# Patient Record
Sex: Female | Born: 2000 | Race: White | Marital: Married | Smoking: Current some day smoker
Health system: Southern US, Community
[De-identification: ages and names within clinical notes are randomized; demographics above are authoritative.]

## PROBLEM LIST (undated history)

## (undated) DIAGNOSIS — F419 Anxiety disorder, unspecified: Secondary | ICD-10-CM

## (undated) DIAGNOSIS — F319 Bipolar disorder, unspecified: Secondary | ICD-10-CM

## (undated) DIAGNOSIS — G40909 Epilepsy, unspecified, not intractable, without status epilepticus: Secondary | ICD-10-CM

## (undated) DIAGNOSIS — F32A Depression, unspecified: Secondary | ICD-10-CM

## (undated) HISTORY — DX: Epilepsy, unspecified, not intractable, without status epilepticus: G40.909

## (undated) HISTORY — DX: Anxiety disorder, unspecified: F41.9

## (undated) HISTORY — DX: Bipolar disorder, unspecified: F31.9

## (undated) HISTORY — PX: APPENDECTOMY: SHX54

## (undated) HISTORY — PX: ADENOIDECTOMY: SUR15

## (undated) HISTORY — PX: TYMPANOSTOMY TUBE PLACEMENT: SHX32

## (undated) HISTORY — DX: Depression, unspecified: F32.A

---

## 2020-09-09 ENCOUNTER — Other Ambulatory Visit: Payer: Self-pay | Admitting: Neurology

## 2020-09-09 DIAGNOSIS — R569 Unspecified convulsions: Secondary | ICD-10-CM

## 2020-09-22 ENCOUNTER — Other Ambulatory Visit: Payer: Self-pay | Admitting: Neurology

## 2020-09-22 ENCOUNTER — Ambulatory Visit
Admission: RE | Admit: 2020-09-22 | Discharge: 2020-09-22 | Disposition: A | Discharge status: Home / Self Care | Payer: 59 | Source: Ambulatory Visit | Attending: Neurology | Admitting: Neurology

## 2020-09-22 ENCOUNTER — Other Ambulatory Visit: Payer: Self-pay

## 2020-09-22 DIAGNOSIS — R569 Unspecified convulsions: Secondary | ICD-10-CM | POA: Insufficient documentation

## 2020-09-22 MED ORDER — GADOBUTROL 1 MMOL/ML IV SOLN: 10.0000 mL | Freq: Once | INTRAVENOUS | Status: DC | PRN

## 2020-09-26 ENCOUNTER — Other Ambulatory Visit: Payer: Self-pay

## 2020-09-26 ENCOUNTER — Encounter: Payer: Self-pay | Admitting: Family Medicine

## 2020-09-26 ENCOUNTER — Ambulatory Visit (INDEPENDENT_AMBULATORY_CARE_PROVIDER_SITE_OTHER): Payer: 59 | Admitting: Family Medicine

## 2020-09-26 VITALS — BP 111/75 | HR 147 | Temp 98.4°F | Resp 16 | Ht 67.5 in | Wt 219.0 lb

## 2020-09-26 DIAGNOSIS — F319 Bipolar disorder, unspecified: Secondary | ICD-10-CM | POA: Diagnosis not present

## 2020-09-26 DIAGNOSIS — R000 Tachycardia, unspecified: Secondary | ICD-10-CM | POA: Diagnosis not present

## 2020-09-26 DIAGNOSIS — G40909 Epilepsy, unspecified, not intractable, without status epilepticus: Secondary | ICD-10-CM | POA: Diagnosis not present

## 2020-09-26 DIAGNOSIS — E559 Vitamin D deficiency, unspecified: Secondary | ICD-10-CM

## 2020-09-26 DIAGNOSIS — R7989 Other specified abnormal findings of blood chemistry: Secondary | ICD-10-CM | POA: Diagnosis not present

## 2020-09-26 DIAGNOSIS — Z8669 Personal history of other diseases of the nervous system and sense organs: Secondary | ICD-10-CM

## 2020-09-26 DIAGNOSIS — E538 Deficiency of other specified B group vitamins: Secondary | ICD-10-CM

## 2020-09-26 NOTE — Progress Notes (Signed)
New patient visit   Patient: Kelly Robertson   DOB: 2001-09-15   19 y.o. Female  MRN: [id number] Visit Date: 09/26/2020  Today's healthcare provider: Dortha Kern, PA-C   Chief Complaint  Patient presents with  . Establish Care   Subjective    Kelly Robertson is a 19 y.o. female who presents today as a new patient to establish care.      Past Medical History:  Diagnosis Date  . Anxiety   . Bipolar disorder (HCC)   . Depression   . Seizure disorder [facility])      Family Status  Relation Name Status  . Mother  (Not Specified)  . Father  (Not Specified)  . MGM  (Not Specified)  . MGF  (Not Specified)  . PGF  (Not Specified)  . Other paternal aunt Alive    Social History   Socioeconomic History  . Marital status: Married    Spouse name: Not on file  . Number of children: Not on file  . Years of education: Not on file  . Highest education level: Not on file  Occupational History  . Not on file  Tobacco Use  . Smoking status: Current Some Day Smoker    Types: E-cigarettes  . Smokeless tobacco: Never Used  Substance and Sexual Activity  . Alcohol use: Never  . Drug use: Not on file  . Sexual activity: Not on file  Other Topics Concern  . Not on file  Social History Narrative  . Not on file   Social Determinants of Health   Financial Resource Strain: Not on file  Food Insecurity: Not on file  Transportation Needs: Not on file  Physical Activity: Not on file  Stress: Not on file  Social Connections: Not on file   Outpatient Medications Prior to Visit  Medication Sig  . citalopram (CELEXA) 10 MG tablet Take 10 mg by mouth daily.  . cyanocobalamin (,VITAMIN B-12,) 1000 MCG/ML injection Inject 1,000 mcg into the muscle every 30 (thirty) days.  . ergocalciferol (VITAMIN D2) 1.25 MG (50000 UT) capsule Take 1 capsule by mouth once a week.  . lamoTRIgine (LAMICTAL) 25 MG tablet Take by mouth. Take 2 tablets (50 mg total) by mouth 2 (two) times daily  for 90 days  . nortriptyline (PAMELOR) 50 MG capsule Take 50 mg by mouth at bedtime.  . propranolol (INDERAL) 10 MG tablet Take 10 mg by mouth daily as needed.  Marland Kitchen QUEtiapine (SEROQUEL XR) 300 MG 24 hr tablet Take 300 mg by mouth at bedtime.  . SUMAtriptan (IMITREX) 100 MG tablet Take by mouth. Take 1 tablet (100 mg total) by mouth as directed for Migraine May take a second dose after 2 hours if needed.  . vitamin B-12 (CYANOCOBALAMIN) 1000 MCG tablet Take 1,000 mcg by mouth daily.   No facility-administered medications prior to visit.   No Known Allergies   There is no immunization history on file for this patient.  Health Maintenance  Topic Date Due  . Hepatitis C Screening  Never done  . COVID-19 Vaccine (1) Never done  . HIV Screening  Never done  . TETANUS/TDAP  Never done  . INFLUENZA VACCINE  05/19/2020    Patient Care Team: Linell Shawn, Maryjean Morn as PCP - General (Family Medicine)  Review of Systems  Constitutional: Negative.   HENT: Negative.   Eyes: Negative.   Respiratory: Negative.   Cardiovascular: Positive for palpitations.  Gastrointestinal: Negative.   Genitourinary: Negative.   Musculoskeletal: Negative.  Neurological: Negative.       Objective    BP 111/75 (BP Location: Right Arm, Patient Position: Sitting, Cuff Size: Large)   Pulse (!) 147   Temp 98.4 F (36.9 C) (Oral)   Resp 16   Ht 5' 7.5" (1.715 m)   LMP 09/02/2020   BMI 33.02 kg/m  Physical Exam Constitutional:      General: She is not in acute distress.    Appearance: She is well-developed and well-nourished.  HENT:     Head: Normocephalic and atraumatic.     Right Ear: Hearing normal.     Left Ear: Hearing normal.     Nose: Nose normal.  Eyes:     General: Lids are normal. No scleral icterus.       Right eye: No discharge.        Left eye: No discharge.     Conjunctiva/sclera: Conjunctivae normal.  Cardiovascular:     Rate and Rhythm: Regular rhythm. Tachycardia present.      Heart sounds: Normal heart sounds.  Pulmonary:     Effort: Pulmonary effort is normal. No respiratory distress.  Musculoskeletal:        General: Normal range of motion.     Cervical back: Neck supple.  Skin:    General: Skin is intact.     Findings: No lesion or rash.  Neurological:     Mental Status: She is alert and oriented to person, place, and time.  Psychiatric:        Mood and Affect: Mood and affect normal.        Speech: Speech normal.        Behavior: Behavior normal.        Thought Content: Thought content normal.      Depression Screen No flowsheet data found. No results found for any visits on 09/26/20.  Assessment & Plan     1. Tachycardia Persistent fast heart beat without complaint of chest pain or dyspnea. Occasional palpitation. Evaluation at [facility] on 08-04-20 showed normal EKG with heart rate of 85. Stated she has noticed episodes of tachycardia since psychiatrist changed meds for Bipolar Disorder. Also, reported some syncopal episodes that was determined to be a seizure disorder. Will recheck labs and may need evaluation by cardiologist. Recommend she take the Propranolol 10 mg regularly instead of only for anxiety attacks. Should go to the ER if chest pains or further syncopal episodes. - CBC with Differential/Platelet - Thyroid Panel With TSH - Comprehensive metabolic panel  2. Elevated TSH TSH was 6.426 on 08-28-20 and neurologist (Dr. Sherryll Burger) scheduled for endocrinology evaluation by Dr. Turner Daniels on 12-04-20. Get follow up of CBC and a more complete thyroid panel.Marland Kitchen Heart rate high today but has been gaining weight without tremor, exophthalmos, pretibial edema, diarrhea, hair loss, dry skin or brittle nails. - CBC with Differential/Platelet - Thyroid Panel With TSH - Comprehensive metabolic panel  3. Seizure disorder [facility]) Diagnosed by Dr. Sherryll Burger (neurologist) and confirmation on EEG on 09-22-20. Presently on Lamictal 50 mg BID. Initial work up found B12 low at  174 and Vitamin D 12,2 on 08-28-20. Presently getting B12 injection 1000 mcg IM q month. Recheck labs and follow up with Dr. Sherryll Burger as planned. - CBC with Differential/Platelet - Thyroid Panel With TSH - Comprehensive metabolic panel  4. Bipolar affective disorder, remission status unspecified (HCC) Followed by [facility] in [facility] for Bipolar Disorder. Treated with Seroquel 300 mg hs, Celexa 10 mg qd and Propranolol 10 mg qd prn  anxiety attacks. Needs recheck and possible dosage adjustments as tachycardia started as these meds were changed or initiated. - CBC with Differential/Platelet - Thyroid Panel With TSH - Comprehensive metabolic panel  5. History of migraine Fair control of headaches with use of Nortriptyline 50 mg hs and prn use of Sumatriptan at onset of migraine. Should follow up with neurologist.  6. Vitamin D deficiency Presently on 50,000 IU q week with Vitamin D level 12.2 on 08-28-20 at [facility]. Recheck CBC. - CBC with Differential/Platelet  7. B12 deficiency Betting IM 1000 mcg injection once a month for B12 level down to 174 on 08-28-20. Schedule follow up pending reports of lab tests. - CBC with Differential/Platelet    No follow-ups on file.        Dortha Kern, PA-C  [facility] [phone] (phone) [phone] (fax)  [facility]

## 2020-09-27 LAB — CBC WITH DIFFERENTIAL/PLATELET
Basophils Absolute: 0 10*3/uL (ref 0.0–0.2)
Basos: 1 %
EOS (ABSOLUTE): 0.2 10*3/uL (ref 0.0–0.4)
Eos: 3 %
Hematocrit: 36.3 % (ref 34.0–46.6)
Hemoglobin: 12.2 g/dL (ref 11.1–15.9)
Immature Grans (Abs): 0 10*3/uL (ref 0.0–0.1)
Immature Granulocytes: 0 %
Lymphocytes Absolute: 2.7 10*3/uL (ref 0.7–3.1)
Lymphs: 35 %
MCH: 25.2 pg — ABNORMAL LOW (ref 26.6–33.0)
MCHC: 33.6 g/dL (ref 31.5–35.7)
MCV: 75 fL — ABNORMAL LOW (ref 79–97)
Monocytes Absolute: 0.6 10*3/uL (ref 0.1–0.9)
Monocytes: 7 %
Neutrophils Absolute: 4.2 10*3/uL (ref 1.4–7.0)
Neutrophils: 54 %
Platelets: 335 10*3/uL (ref 150–450)
RBC: 4.84 x10E6/uL (ref 3.77–5.28)
RDW: 13.5 % (ref 11.7–15.4)
WBC: 7.8 10*3/uL (ref 3.4–10.8)

## 2020-09-27 LAB — THYROID PANEL WITH TSH
Free Thyroxine Index: 1.3 (ref 1.2–4.9)
T3 Uptake Ratio: 25 % (ref 24–39)
T4, Total: 5.2 ug/dL (ref 4.5–12.0)
TSH: 2.58 u[IU]/mL (ref 0.450–4.500)

## 2020-09-27 LAB — COMPREHENSIVE METABOLIC PANEL
ALT: 24 IU/L (ref 0–32)
AST: 15 IU/L (ref 0–40)
Albumin/Globulin Ratio: 1.5 (ref 1.2–2.2)
Albumin: 4.4 g/dL (ref 3.9–5.0)
Alkaline Phosphatase: 105 IU/L (ref 42–106)
BUN/Creatinine Ratio: 9 (ref 9–23)
BUN: 7 mg/dL (ref 6–20)
Bilirubin Total: 0.6 mg/dL (ref 0.0–1.2)
CO2: 21 mmol/L (ref 20–29)
Calcium: 9.2 mg/dL (ref 8.7–10.2)
Chloride: 103 mmol/L (ref 96–106)
Creatinine, Ser: 0.82 mg/dL (ref 0.57–1.00)
GFR calc Af Amer: 120 mL/min/{1.73_m2} (ref >59)
GFR calc non Af Amer: 104 mL/min/{1.73_m2} (ref >59)
Globulin, Total: 2.9 g/dL (ref 1.5–4.5)
Glucose: 104 mg/dL — ABNORMAL HIGH (ref 65–99)
Potassium: 4 mmol/L (ref 3.5–5.2)
Sodium: 137 mmol/L (ref 134–144)
Total Protein: 7.3 g/dL (ref 6.0–8.5)

## 2020-12-09 ENCOUNTER — Ambulatory Visit (INDEPENDENT_AMBULATORY_CARE_PROVIDER_SITE_OTHER): Payer: 59 | Admitting: Physician Assistant

## 2020-12-09 ENCOUNTER — Ambulatory Visit: Payer: Self-pay | Admitting: *Deleted

## 2020-12-09 ENCOUNTER — Encounter: Payer: Self-pay | Admitting: Physician Assistant

## 2020-12-09 ENCOUNTER — Other Ambulatory Visit: Payer: Self-pay

## 2020-12-09 VITALS — BP 102/70 | HR 98 | Temp 98.5°F | Wt 213.0 lb

## 2020-12-09 DIAGNOSIS — T7840XA Allergy, unspecified, initial encounter: Secondary | ICD-10-CM

## 2020-12-09 DIAGNOSIS — R21 Rash and other nonspecific skin eruption: Secondary | ICD-10-CM

## 2020-12-09 DIAGNOSIS — F319 Bipolar disorder, unspecified: Secondary | ICD-10-CM | POA: Insufficient documentation

## 2020-12-09 DIAGNOSIS — G40909 Epilepsy, unspecified, not intractable, without status epilepticus: Secondary | ICD-10-CM | POA: Diagnosis not present

## 2020-12-09 MED ORDER — LEVETIRACETAM 500 MG PO TABS: 500.0000 mg | ORAL_TABLET | Freq: Two times a day (BID) | ORAL | 1 refills | Status: DC

## 2020-12-09 MED ORDER — BETAMETHASONE DIPROPIONATE AUG 0.05 % EX CREA: TOPICAL_CREAM | Freq: Two times a day (BID) | CUTANEOUS | 0 refills | Status: DC

## 2020-12-09 NOTE — Progress Notes (Signed)
Established patient visit   Patient: Kelly Robertson   DOB: [birth year]   20 y.o. Female  MRN: [id number] Visit Date: 12/09/2020  Today's healthcare provider: Margaretann Loveless, PA-C   Chief Complaint  Patient presents with  . Allergic Reaction   Subjective    Allergic Reaction This is a new problem. The problem has been gradually improving since onset. The patient was exposed to a prescription drug. Associated symptoms include itching and a rash. Pertinent negatives include no abdominal pain, chest pain, chest pressure, coughing, difficulty breathing or trouble swallowing. Swelling is present on the tongue. Treatments tried: Stopped lamotrigine  The treatment provided moderate relief.   Pt also reports having 3-4 focal seizures a day since stopping lamotrigine.   Developed mouth sores with bleeding, severe itching, and a blistering rash that is painful on the right side of the face after increasing lamotrigine. Since she has stopped the medication the rash, mouth sores and itching are slowly improving.   HPI    To lamotrigine after increasing to 150mg  daily.    Last edited by , CMA on 12/09/2020  3:31 PM. (History)        There are no problems to display for this patient.      Medications: Outpatient Medications Prior to Visit  Medication Sig  . citalopram (CELEXA) 10 MG tablet Take 10 mg by mouth daily.  . cyanocobalamin (,VITAMIN B-12,) 1000 MCG/ML injection Inject 1,000 mcg into the muscle every 30 (thirty) days.  . propranolol (INDERAL) 10 MG tablet Take 10 mg by mouth 2 (two) times daily.  . SUMAtriptan (IMITREX) 100 MG tablet Take by mouth. Take 1 tablet (100 mg total) by mouth as directed for Migraine May take a second dose after 2 hours if needed.  . vitamin B-12 (CYANOCOBALAMIN) 1000 MCG tablet Take 1,000 mcg by mouth daily.  12/11/2020 lamoTRIgine (LAMICTAL) 25 MG tablet Take by mouth. Take 2 tablets (50 mg total) by mouth 2 (two) times daily for 90  days (Patient not taking: Reported on 12/09/2020)  . nortriptyline (PAMELOR) 50 MG capsule Take 50 mg by mouth at bedtime. (Patient not taking: Reported on 12/09/2020)  . QUEtiapine (SEROQUEL XR) 300 MG 24 hr tablet Take 300 mg by mouth at bedtime. (Patient not taking: Reported on 12/09/2020)   No facility-administered medications prior to visit.    Review of Systems  Constitutional: Negative.   HENT: Positive for mouth sores and tinnitus. Negative for sore throat and trouble swallowing.   Respiratory: Negative.  Negative for cough.   Cardiovascular: Negative for chest pain.  Gastrointestinal: Negative for abdominal pain.  Skin: Positive for itching and rash. Negative for color change, pallor and wound.        Objective    BP 102/70 (BP Location: Right Arm, Patient Position: Sitting, Cuff Size: Large)   Pulse 98   Temp 98.5 F (36.9 C) (Oral)   Wt 213 lb (96.6 kg)   BMI 32.87 kg/m    Physical Exam Vitals reviewed.  Constitutional:      General: She is not in acute distress.    Appearance: Normal appearance. She is well-developed and well-nourished. She is obese. She is not ill-appearing or diaphoretic.  HENT:     Head: Normocephalic and atraumatic.   Neck:     Thyroid: No thyromegaly.     Vascular: No JVD.     Trachea: No tracheal deviation.  Cardiovascular:     Rate and Rhythm: Normal rate and regular  rhythm.     Heart sounds: Normal heart sounds. No murmur heard. No friction rub. No gallop.   Pulmonary:     Effort: Pulmonary effort is normal. No respiratory distress.     Breath sounds: Normal breath sounds. No wheezing or rales.  Musculoskeletal:     Cervical back: Normal range of motion and neck supple.  Lymphadenopathy:     Cervical: No cervical adenopathy.  Neurological:     Mental Status: She is alert.       No results found for any visits on 12/09/20.  Assessment & Plan     1. 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   2. Seizure disorder (HCC) Will change therapy to Keppra as below. Psychiatrist is going to add a new mood stabilizer instead of trying to use one medication to treat both. Referral for a new Neurologist was placed as below. Patient previously seen by Dr. Sherryll Burger. Wishes to see someone in [place] and preferably female. Call if any side effects occur before seen by Neurology.  - levETIRAcetam (KEPPRA) 500 MG tablet; Take 1 tablet (500 mg total) by mouth 2 (two) times daily.  Dispense: 60 tablet; Refill: 1 - Ambulatory referral to Neurology  3. Bipolar affective disorder, remission status unspecified (HCC) See above medical treatment plan. - Ambulatory referral to Neurology  4. Rash Betamethasone given for the rash on the cheek. Call if not improving.  - augmented betamethasone dipropionate (DIPROLENE-AF) 0.05 % cream; Apply topically 2 (two) times daily.  Dispense: 30 g; Refill: 0   No follow-ups on file.      Delmer Islam, PA-C, have reviewed all documentation for this visit. The documentation on 12/09/20 for the exam, diagnosis, procedures, and orders are all accurate and complete.   Reine Just  [facility] [phone] (phone) [phone] (fax)  [facility]

## 2020-12-09 NOTE — Telephone Encounter (Signed)
Mallery calls-Recently taken off Seroquel XR 300 mg tabs now only taking Lamictal 25 mg tabs which cause itching, rash on cheeks and other areas, sores in the mouth. Reporting the Seroquel helped prevent the itching and breakout rash from the Lamictal. Rash on cheeks scabbed over and mouth sores healing and itching improved with taking benadryl daily. She stopped taking Lamictal 3 days ago due to these side effects and has been having mini short focal seizures on/off since then. Appointment scheduled for today via DT with Virl Axe, PA for this afternoon  Reason for Disposition . [1] Caller has URGENT medicine question about med that PCP or specialist prescribed AND [2] triager unable to answer question  Answer Assessment - Initial Assessment Questions 1. NAME of MEDICATION: "What medicine are you calling about?"     Lamictal  2. QUESTION: "What is your question?" (e.g., medication refill, side effect)     Side effect 3. PRESCRIBING HCP: "Who prescribed it?" Reason: if prescribed by specialist, call should be referred to that group.     Psychiatrist 4. SYMPTOMS: "Do you have any symptoms?"     Clearing rash on cheeks at this time, sore in mouth. 5. SEVERITY: If symptoms are present, ask "Are they mild, moderate or severe?"     mild 6. PREGNANCY:  "Is there any chance that you are pregnant?" "When was your last menstrual period?"     na  Protocols used: MEDICATION QUESTION CALL-A-AH

## 2020-12-09 NOTE — Patient Instructions (Signed)
Levetiracetam tablets What is this medicine? LEVETIRACETAM (lee ve tye RA se tam) is an antiepileptic drug. It is used with other medicines to treat certain types of seizures. This medicine may be used for other purposes; ask your health care provider or pharmacist if you have questions. COMMON BRAND NAME(S): Keppra, Roweepra What should I tell my health care provider before I take this medicine? They need to know if you have any of these conditions:  kidney disease  suicidal thoughts, plans, or attempt; a previous suicide attempt by you or a family member  an unusual or allergic reaction to levetiracetam, other medicines, foods, dyes, or preservatives  pregnant or trying to get pregnant  breast-feeding How should I use this medicine? Take this medicine by mouth with a glass of water. Follow the directions on the prescription label. Swallow the tablets whole. Do not crush or chew this medicine. You may take this medicine with or without food. Take your doses at regular intervals. Do not take your medicine more often than directed. Do not stop taking this medicine or any of your seizure medicines unless instructed by your doctor or health care professional. Stopping your medicine suddenly can increase your seizures or their severity. A special MedGuide will be given to you by the pharmacist with each prescription and refill. Be sure to read this information carefully each time. Contact your pediatrician or health care professional regarding the use of this medication in children. While this drug may be prescribed for children as young as 4 years of age for selected conditions, precautions do apply. Overdosage: If you think you have taken too much of this medicine contact a poison control center or emergency room at once. NOTE: This medicine is only for you. Do not share this medicine with others. What if I miss a dose? If you miss a dose, take it as soon as you can. If it is almost time for  your next dose, take only that dose. Do not take double or extra doses. What may interact with this medicine? This medicine may interact with the following medications:  carbamazepine  colesevelam  probenecid  sevelamer This list may not describe all possible interactions. Give your health care provider a list of all the medicines, herbs, non-prescription drugs, or dietary supplements you use. Also tell them if you smoke, drink alcohol, or use illegal drugs. Some items may interact with your medicine. What should I watch for while using this medicine? Visit your doctor or health care provider for a regular check on your progress. Wear a medical identification bracelet or chain to say you have epilepsy, and carry a card that lists all your medications. This medicine may cause serious skin reactions. They can happen weeks to months after starting the medicine. Contact your health care provider right away if you notice fevers or flu-like symptoms with a rash. The rash may be red or purple and then turn into blisters or peeling of the skin. Or, you might notice a red rash with swelling of the face, lips or lymph nodes in your neck or under your arms. It is important to take this medicine exactly as instructed by your health care provider. When first starting treatment, your dose may need to be adjusted. It may take weeks or months before your dose is stable. You should contact your doctor or health care provider if your seizures get worse or if you have any new types of seizures. You may get drowsy or dizzy. Do not drive,   use machinery, or do anything that needs mental alertness until you know how this medicine affects you. Do not stand or sit up quickly, especially if you are an older patient. This reduces the risk of dizzy or fainting spells. Alcohol may interfere with the effect of this medicine. Avoid alcoholic drinks. The use of this medicine may increase the chance of suicidal thoughts or actions.  Pay special attention to how you are responding while on this medicine. Any worsening of mood, or thoughts of suicide or dying should be reported to your health care provider right away. Women who become pregnant while using this medicine may enroll in the [place] American Antiepileptic Drug Pregnancy Registry by calling [phone]. This registry collects information about the safety of antiepileptic drug use during pregnancy. What side effects may I notice from receiving this medicine? Side effects that you should report to your doctor or health care professional as soon as possible:  allergic reactions like skin rash, itching or hives, swelling of the face, lips, or tongue  breathing problems  dark urine  general ill feeling or flu-like symptoms  problems with balance, talking, walking  rash, fever, and swollen lymph nodes  redness, blistering, peeling or loosening of the skin, including inside the mouth  unusually weak or tired  worsening of mood, thoughts or actions of suicide or dying  yellowing of the eyes or skin Side effects that usually do not require medical attention (report to your doctor or health care professional if they continue or are bothersome):  diarrhea  dizzy, drowsy  headache  loss of appetite This list may not describe all possible side effects. Call your doctor for medical advice about side effects. You may report side effects to FDA at 1-800-FDA-1088. Where should I keep my medicine? Keep out of reach of children. Store at room temperature between 15 and 30 degrees C (59 and 86 degrees F). Throw away any unused medicine after the expiration date. NOTE: This sheet is a summary. It may not cover all possible information. If you have questions about this medicine, talk to your doctor, pharmacist, or health care provider.  2021 Elsevier/Gold Standard (2019-01-06 15:23:36)

## 2020-12-18 ENCOUNTER — Telehealth (INDEPENDENT_AMBULATORY_CARE_PROVIDER_SITE_OTHER): Payer: 59 | Admitting: Physician Assistant

## 2020-12-18 ENCOUNTER — Encounter: Payer: Self-pay | Admitting: Physician Assistant

## 2020-12-18 ENCOUNTER — Telehealth: Payer: Self-pay

## 2020-12-18 ENCOUNTER — Other Ambulatory Visit: Payer: Self-pay

## 2020-12-18 VITALS — Temp 98.0°F

## 2020-12-18 DIAGNOSIS — L089 Local infection of the skin and subcutaneous tissue, unspecified: Secondary | ICD-10-CM

## 2020-12-18 DIAGNOSIS — B009 Herpesviral infection, unspecified: Secondary | ICD-10-CM | POA: Diagnosis not present

## 2020-12-18 MED ORDER — VALACYCLOVIR HCL 1 G PO TABS: 1000.0000 mg | ORAL_TABLET | Freq: Three times a day (TID) | ORAL | 0 refills | Status: AC

## 2020-12-18 MED ORDER — CEPHALEXIN 500 MG PO CAPS: 500.0000 mg | ORAL_CAPSULE | Freq: Two times a day (BID) | ORAL | 0 refills | Status: DC

## 2020-12-18 NOTE — Progress Notes (Signed)
MyChart Video Visit    Virtual Visit via Video Note   This visit type was conducted due to national recommendations for restrictions regarding the COVID-19 Pandemic (e.g. social distancing) in an effort to limit this patient's exposure and mitigate transmission in our community. This patient is at least at moderate risk for complications without adequate follow up. This format is felt to be most appropriate for this patient at this time. Physical exam was limited by quality of the video and audio technology used for the visit.   Interactive audio and video communications were attempted, although failed due to patient's inability to connect to video. Continued visit with audio only interaction with patient agreement.  Patient location: Home Provider location: [facility]  I discussed the limitations of evaluation and management by telemedicine and the availability of in person appointments. The patient expressed understanding and agreed to proceed.  Patient: Kelly Robertson   DOB: Nov 23, 2000   19 y.o. Female  MRN: [id number] Visit Date: 12/18/2020  Today's healthcare provider: Margaretann Loveless, PA-C   Chief Complaint  Patient presents with  . Fever   Subjective    Fever  This is a new problem. The current episode started in the past 7 days. The maximum temperature noted was 100 to 100.9 F (had fever for about 3 days; first day was 102, since has been in the 100 range. None today). The temperature was taken using an oral thermometer. Associated symptoms include abdominal pain, muscle aches and a rash. Pertinent negatives include no chest pain, congestion, coughing, diarrhea, ear pain, headaches, nausea, sleepiness, sore throat, urinary pain, vomiting or wheezing. She has tried NSAIDs for the symptoms. The treatment provided mild relief.    Covid testing is negative.  Patient Active Problem List   Diagnosis Date Noted  . Bipolar disorder (HCC) 12/09/2020  . Seizure disorder (HCC)  12/09/2020   Past Medical History:  Diagnosis Date  . Anxiety   . Bipolar disorder (HCC)   . Depression   . Seizure disorder (HCC)    Allergies  Allergen Reactions  . Lamictal [Lamotrigine] Rash    Had blistering rash of the face, mouth sores with bleeding and severe itching      Medications: Outpatient Medications Prior to Visit  Medication Sig  . augmented betamethasone dipropionate (DIPROLENE-AF) 0.05 % cream Apply topically 2 (two) times daily.  . citalopram (CELEXA) 10 MG tablet Take 10 mg by mouth daily.  . cyanocobalamin (,VITAMIN B-12,) 1000 MCG/ML injection Inject 1,000 mcg into the muscle every 30 (thirty) days.  Marland Kitchen levETIRAcetam (KEPPRA) 500 MG tablet Take 1 tablet (500 mg total) by mouth 2 (two) times daily.  . propranolol (INDERAL) 10 MG tablet Take 10 mg by mouth 2 (two) times daily.  . SUMAtriptan (IMITREX) 100 MG tablet Take by mouth. Take 1 tablet (100 mg total) by mouth as directed for Migraine May take a second dose after 2 hours if needed.  . vitamin B-12 (CYANOCOBALAMIN) 1000 MCG tablet Take 1,000 mcg by mouth daily.   No facility-administered medications prior to visit.    Review of Systems  Constitutional: Positive for fever.  HENT: Negative for congestion, ear pain and sore throat.   Respiratory: Negative for cough and wheezing.   Cardiovascular: Negative for chest pain.  Gastrointestinal: Positive for abdominal pain. Negative for diarrhea, nausea and vomiting.  Genitourinary: Negative for dysuria.  Skin: Positive for rash.  Neurological: Negative for headaches.    Last CBC Lab Results  Component Value Date  WBC 7.8 09/26/2020   HGB 12.2 09/26/2020   HCT 36.3 09/26/2020   MCV 75 (L) 09/26/2020   MCH 25.2 (L) 09/26/2020   RDW 13.5 09/26/2020   PLT 335 09/26/2020   Last metabolic panel Lab Results  Component Value Date   GLUCOSE 104 (H) 09/26/2020   NA 137 09/26/2020   K 4.0 09/26/2020   CL 103 09/26/2020   CO2 21 09/26/2020   BUN 7  09/26/2020   CREATININE 0.82 09/26/2020   GFRNONAA 104 09/26/2020   GFRAA 120 09/26/2020   CALCIUM 9.2 09/26/2020   PROT 7.3 09/26/2020   ALBUMIN 4.4 09/26/2020   LABGLOB 2.9 09/26/2020   AGRATIO 1.5 09/26/2020   BILITOT 0.6 09/26/2020   ALKPHOS 105 09/26/2020   AST 15 09/26/2020   ALT 24 09/26/2020      Objective    Temp 98 F (36.7 C) (Oral)  BP Readings from Last 3 Encounters:  12/09/20 102/70  09/26/20 111/75   Wt Readings from Last 3 Encounters:  12/09/20 213 lb (96.6 kg) (98 %, Z= 2.12)*  09/26/20 219 lb (99.3 kg) (99 %, Z= 2.20)*  09/22/20 214 lb (97.1 kg) (98 %, Z= 2.14)*   * Growth percentiles are based on CDC (Girls, 2-20 Years) data.      Physical Exam     Assessment & Plan     1. Skin infection Has skin lesion on face that continues to build pus under a scab, so much so that it is causing the scab to push off from the discharge underneath. Will treat for possible skin infection/possible impetigo with keflex as noted below. Call if worsening.  - cephALEXin (KEFLEX) 500 MG capsule; Take 1 capsule (500 mg total) by mouth 2 (two) times daily.  Dispense: 14 capsule; Refill: 0  2. 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 Had reaction to lamictal that may have flared this. Will give valtrex as below. Call if worsening  - valACYclovir (VALTREX) 1000 MG tablet; Take 1 tablet (1,000 mg total) by mouth 3 (three) times daily.  Dispense: 21 tablet; Refill: 0   No follow-ups on file.     I discussed the assessment and treatment plan with the patient. The patient was provided an opportunity to ask questions and all were answered. The patient agreed with the plan and demonstrated an understanding of the instructions.   The patient was advised to call back or seek an in-person evaluation if the symptoms worsen or if the condition fails to improve as anticipated.  I provided 8 minutes of non-face-to-face time during this encounter.  Delmer Islam, PA-C, have  reviewed all documentation for this visit. The documentation on 12/18/20 for the exam, diagnosis, procedures, and orders are all accurate and complete.  Reine Just [facility] [phone] (phone) [phone] (fax)  [facility]

## 2020-12-18 NOTE — Telephone Encounter (Signed)
Copied from CRM [id number]. Topic: General - Other >> Dec 18, 2020  1:19 PM Jaquita Rector A wrote: Reason for CRM: Patient wife called in to inform Joycelyn Man that patient has a rash on her face that that are liquid and puss filled, scabs over and leak, fever, muscle pain, lightheadedness had diarrhea one day think she has an infection. Took Covid test that was negative on 12/17/20. Please advise patient has a virtual visit set for 2.20 PM Ph# [phone]

## 2020-12-25 ENCOUNTER — Ambulatory Visit: Payer: Self-pay | Admitting: *Deleted

## 2020-12-25 NOTE — Telephone Encounter (Signed)
I returned pt's call.  Her wife, Kelly Robertson answered and introduced herself as Kelly Robertson's wife.   Kelly Robertson is on the [facility].   Kelly Robertson is at work so [employer] on her behalf.  I was not able to locate any information specifically pertaining to interactions between Keppra and Triazolam.  I suggested she call her pharmacist.   She was agreeable to this and thanked me for my help.    Reason for Disposition . [1] Caller has medicine question about med NOT prescribed by PCP AND [2] triager unable to answer question (e.g., compatibility with other med, storage)  Answer Assessment - Initial Assessment Questions 1. NAME of MEDICATION: "What medicine are you calling about?"     Keppra interactions with Triazolam.   She is for a dental procedure tomorrow and she is prescribed the Triazolam for sedation.   She also takes Keppra 2. QUESTION: "What is your question?" (e.g., medication refill, side effect)     She is wondering if there is an interaction that could happen with both of these drugs on board. 3. PRESCRIBING HCP: "Who prescribed it?" Reason: if prescribed by specialist, call should be referred to that group.     Her dentist prescribed the Triazolam. 4. SYMPTOMS: "Do you have any symptoms?"     N/A 5. SEVERITY: If symptoms are present, ask "Are they mild, moderate or severe?"     N/A 6. PREGNANCY:  "Is there any chance that you are pregnant?" "When was your last menstrual period?"     Not asked  Protocols used: MEDICATION QUESTION CALL-A-AH

## 2021-02-03 ENCOUNTER — Other Ambulatory Visit: Payer: Self-pay

## 2021-02-03 DIAGNOSIS — G40909 Epilepsy, unspecified, not intractable, without status epilepticus: Secondary | ICD-10-CM

## 2021-02-03 MED ORDER — LEVETIRACETAM 500 MG PO TABS: 500.0000 mg | ORAL_TABLET | Freq: Two times a day (BID) | ORAL | 0 refills | Status: AC

## 2021-02-03 NOTE — Telephone Encounter (Signed)
[facility] faxed refill request for the following medications:  levETIRAcetam (KEPPRA) 500 MG tablet   Please advise.

## 2021-02-03 NOTE — Telephone Encounter (Signed)
Patient requesting refill with PCP due to switching neurologist. Patient reports she will not be going back to Dr. Sherryll Burger.  Please advise.

## 2021-02-20 ENCOUNTER — Encounter: Payer: Self-pay | Admitting: Neurology

## 2021-02-20 ENCOUNTER — Ambulatory Visit: Payer: 59 | Admitting: Neurology

## 2021-02-20 ENCOUNTER — Telehealth: Payer: Self-pay | Admitting: *Deleted

## 2021-02-20 NOTE — Telephone Encounter (Signed)
Patient was no show for new patient appointment today. 

## 2021-06-02 ENCOUNTER — Other Ambulatory Visit: Payer: Self-pay

## 2021-06-02 ENCOUNTER — Telehealth: Payer: 59 | Admitting: Family Medicine

## 2021-06-02 NOTE — Progress Notes (Deleted)
    MyChart Video Visit    Virtual Visit via Video Note   This visit type was conducted due to national recommendations for restrictions regarding the COVID-19 Pandemic (e.g. social distancing) in an effort to limit this patient's exposure and mitigate transmission in our community. This patient is at least at moderate risk for complications without adequate follow up. This format is felt to be most appropriate for this patient at this time. Physical exam was limited by quality of the video and audio technology used for the visit.   Patient location: *** Provider location: ***  I discussed the limitations of evaluation and management by telemedicine and the availability of in person appointments. The patient expressed understanding and agreed to proceed.  Patient: Kelly Robertson   DOB: October 29, 2000   20 y.o. Female  MRN: [id number] Visit Date: 06/02/2021  Today's healthcare provider: Jacky Kindle, FNP   No chief complaint on file.  Subjective    Gastroesophageal Reflux     {Show patient history (optional):23778}  Medications: Outpatient Medications Prior to Visit  Medication Sig  . augmented betamethasone dipropionate (DIPROLENE-AF) 0.05 % cream Apply topically 2 (two) times daily.  . cephALEXin (KEFLEX) 500 MG capsule Take 1 capsule (500 mg total) by mouth 2 (two) times daily.  . citalopram (CELEXA) 10 MG tablet Take 10 mg by mouth daily.  . cyanocobalamin (,VITAMIN B-12,) 1000 MCG/ML injection Inject 1,000 mcg into the muscle every 30 (thirty) days.  Marland Kitchen levETIRAcetam (KEPPRA) 500 MG tablet Take 1 tablet (500 mg total) by mouth 2 (two) times daily.  . propranolol (INDERAL) 10 MG tablet Take 10 mg by mouth 2 (two) times daily.  . SUMAtriptan (IMITREX) 100 MG tablet Take by mouth. Take 1 tablet (100 mg total) by mouth as directed for Migraine May take a second dose after 2 hours if needed.  . valACYclovir (VALTREX) 1000 MG tablet Take 1 tablet (1,000 mg total) by mouth 3 (three)  times daily.  . vitamin B-12 (CYANOCOBALAMIN) 1000 MCG tablet Take 1,000 mcg by mouth daily.   No facility-administered medications prior to visit.    Review of Systems  {Labs  Heme  Chem  Endocrine  Serology  Results Review (optional):23779}  Objective    There were no vitals taken for this visit. {Show previous vital signs (optional):23777}  Physical Exam     Assessment & Plan     ***  No follow-ups on file.     I discussed the assessment and treatment plan with the patient. The patient was provided an opportunity to ask questions and all were answered. The patient agreed with the plan and demonstrated an understanding of the instructions.   The patient was advised to call back or seek an in-person evaluation if the symptoms worsen or if the condition fails to improve as anticipated.  I provided *** minutes of non-face-to-face time during this encounter.  {provider attestation***:1}  Jacky Kindle, FNP [facility] [phone] (phone) [phone] (fax)  [facility]

## 2021-06-02 NOTE — Progress Notes (Signed)
Virtual telephone visit    Virtual Visit via Telephone Note   This visit type was conducted due to national recommendations for restrictions regarding the COVID-19 Pandemic (e.g. social distancing) in an effort to limit this patient's exposure and mitigate transmission in our community. Due to her co-morbid illnesses, this patient is at least at moderate risk for complications without adequate follow up. This format is felt to be most appropriate for this patient at this time. The patient did not have access to video technology or had technical difficulties with video requiring transitioning to audio format only (telephone). Physical exam was limited to content and character of the telephone converstion.    Patient location: home with spouse Provider location: [facility] office, [place], [place]  I discussed the limitations of evaluation and management by telemedicine and the availability of in person appointments. The patient expressed understanding and agreed to proceed.   Visit Date: 06/03/2021  Today's healthcare provider: Jacky Kindle, FNP   Chief Complaint  Patient presents with   Gastroesophageal Reflux   Subjective    Gastroesophageal Reflux She complains of abdominal pain, early satiety, globus sensation and heartburn. She reports no belching, no chest pain, no choking, no coughing, no dysphagia, no hoarse voice, no nausea, no sore throat, no stridor, no tooth decay, no water brash or no wheezing. This is a chronic problem. The problem occurs constantly. The problem has been unchanged. The heartburn is located in the substernum. The heartburn is of severe intensity. The heartburn wakes her from sleep. The heartburn does not limit her activity. Nothing aggravates the symptoms. Associated symptoms include anemia. Pertinent negatives include no fatigue, melena, muscle weakness, orthopnea or weight loss. She has tried an antacid (Zantac, Mylanta) for the symptoms. The treatment provided  mild relief.       Patient Active Problem List   Diagnosis Date Noted   Gastroesophageal reflux disease 06/03/2021   Obesity 06/03/2021   Bloating 06/03/2021   Constipation 06/03/2021   Bipolar disorder (HCC) 12/09/2020   Seizure disorder (HCC) 12/09/2020   Past Medical History:  Diagnosis Date   Anxiety    Bipolar disorder (HCC)    Depression    Seizure disorder (HCC)    Allergies  Allergen Reactions   Lamictal [Lamotrigine] Rash    Had blistering rash of the face, mouth sores with bleeding and severe itching      Medications: Outpatient Medications Prior to Visit  Medication Sig   citalopram (CELEXA) 10 MG tablet Take 10 mg by mouth daily.   levETIRAcetam (KEPPRA) 500 MG tablet Take 1 tablet (500 mg total) by mouth 2 (two) times daily.   propranolol (INDERAL) 10 MG tablet Take 10 mg by mouth 2 (two) times daily.   SUMAtriptan (IMITREX) 100 MG tablet Take by mouth. Take 1 tablet (100 mg total) by mouth as directed for Migraine May take a second dose after 2 hours if needed.   valACYclovir (VALTREX) 1000 MG tablet Take 1 tablet (1,000 mg total) by mouth 3 (three) times daily.   vitamin B-12 (CYANOCOBALAMIN) 1000 MCG tablet Take 1,000 mcg by mouth daily.   [DISCONTINUED] augmented betamethasone dipropionate (DIPROLENE-AF) 0.05 % cream Apply topically 2 (two) times daily.   [DISCONTINUED] cephALEXin (KEFLEX) 500 MG capsule Take 1 capsule (500 mg total) by mouth 2 (two) times daily.   [DISCONTINUED] cyanocobalamin (,VITAMIN B-12,) 1000 MCG/ML injection Inject 1,000 mcg into the muscle every 30 (thirty) days.   No facility-administered medications prior to visit.    Review  of Systems  Constitutional: Negative.  Negative for appetite change, fatigue and weight loss.  HENT:  Negative for hoarse voice and sore throat.   Respiratory:  Negative for cough, choking, chest tightness, shortness of breath and wheezing.   Cardiovascular:  Negative for chest pain.  Gastrointestinal:   Positive for abdominal pain, constipation and heartburn. Negative for abdominal distention, diarrhea, dysphagia, melena, nausea and vomiting.  Musculoskeletal:  Negative for muscle weakness.  Psychiatric/Behavioral: Negative.       Objective    There were no vitals taken for this visit.     Assessment & Plan     Problem List Items Addressed This Visit       Digestive   Gastroesophageal reflux disease - Primary    Daily concerns Has tried many OTCs will little to no relief Currently using Pepto at least twice a day Start PPI Encourage dietary changes Add exercise Discussed not eating/drinking acidic or fatty foods late at night Discussed addition of pillow on top of normal sleeping pillow to prevent reflux Noted for reflux issues as an infant, unaware of previous Rx       Relevant Medications   pantoprazole (PROTONIX) 40 MG tablet     Other   Obesity    Last known weight 213# Discussed balance diet Avoiding "fast food" or prepared foods Goal of 64 oz of water daily Adding exercise into routine, working to goal of 150 mins/wk      Bloating    Complaints of bloating Discussed water intake and use of whole foods, as well as foods with known fiber Encourage activity       Constipation    Current BM regimen is one BM per day Discussed diet and exercise correlation Continue to monitor.         Return if symptoms worsen or fail to improve, for annual examination.    I discussed the assessment and treatment plan with the patient. The patient was provided an opportunity to ask questions and all were answered. The patient agreed with the plan and demonstrated an understanding of the instructions.   The patient was advised to call back or seek an in-person evaluation if the symptoms worsen or if the condition fails to improve as anticipated.  I provided 12 minutes of non-face-to-face time during this encounter.  Leilani Merl, FNP, have reviewed all  documentation for this visit. The documentation on 06/03/21 for the exam, diagnosis, procedures, and orders are all accurate and complete.   Jacky Kindle, FNP [facility] [phone] (phone) [phone] (fax)  [facility]

## 2021-06-03 ENCOUNTER — Telehealth (INDEPENDENT_AMBULATORY_CARE_PROVIDER_SITE_OTHER): Payer: 59 | Admitting: Family Medicine

## 2021-06-03 ENCOUNTER — Other Ambulatory Visit: Payer: Self-pay

## 2021-06-03 DIAGNOSIS — R140 Abdominal distension (gaseous): Secondary | ICD-10-CM

## 2021-06-03 DIAGNOSIS — E669 Obesity, unspecified: Secondary | ICD-10-CM | POA: Diagnosis not present

## 2021-06-03 DIAGNOSIS — K219 Gastro-esophageal reflux disease without esophagitis: Secondary | ICD-10-CM | POA: Diagnosis not present

## 2021-06-03 DIAGNOSIS — K5900 Constipation, unspecified: Secondary | ICD-10-CM | POA: Diagnosis not present

## 2021-06-03 MED ORDER — PANTOPRAZOLE SODIUM 40 MG PO TBEC
40.0000 mg | DELAYED_RELEASE_TABLET | Freq: Every day | ORAL | 3 refills | Status: AC
Start: 2021-06-03 — End: ?

## 2021-06-03 NOTE — Assessment & Plan Note (Signed)
Complaints of bloating Discussed water intake and use of whole foods, as well as foods with known fiber Encourage activity

## 2021-06-03 NOTE — Assessment & Plan Note (Signed)
Daily concerns Has tried many OTCs will little to no relief Currently using Pepto at least twice a day Start PPI Encourage dietary changes Add exercise Discussed not eating/drinking acidic or fatty foods late at night Discussed addition of pillow on top of normal sleeping pillow to prevent reflux Noted for reflux issues as an infant, unaware of previous Rx

## 2021-06-03 NOTE — Assessment & Plan Note (Signed)
Current BM regimen is one BM per day Discussed diet and exercise correlation Continue to monitor.

## 2021-06-03 NOTE — Assessment & Plan Note (Signed)
Last known weight 213# Discussed balance diet Avoiding "fast food" or prepared foods Goal of 64 oz of water daily Adding exercise into routine, working to goal of 150 mins/wk

## 2021-09-03 IMAGING — MR MR HEAD W/O CM
10 of 15 series · 33 of 48 positions shown · non-contrast
Comparison: None available.

CLINICAL DATA: Initial evaluation for intermittent syncope with
dizziness for 2 years.

EXAM:
MRI HEAD WITHOUT CONTRAST
TECHNIQUE: Multiplanar, multiecho pulse sequences of the brain and surrounding
structures were obtained without intravenous contrast. Please note
that this scan was ordered as a brain with and without contrast,
however, IV access was unable to be obtained for contrast
administration.

[Series 5: T1 · sagittal · 5.0mm · 0.62mm/px · 2 of 25 slices shown (1 of 3)]
[im 1/25]
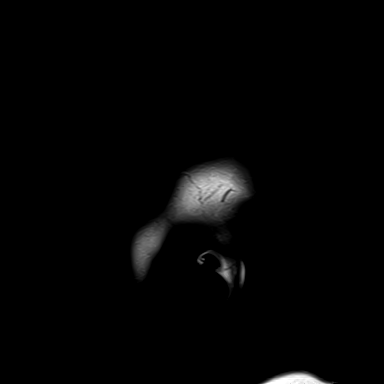
[im 25/25]
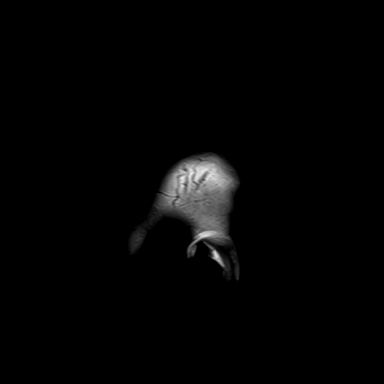

[Series 6: ax dwi_tracew · axial · 3.0mm · 0.60mm/px · z∈[-117,+37]mm · 5 of 96 slices shown]
[im 1/96]
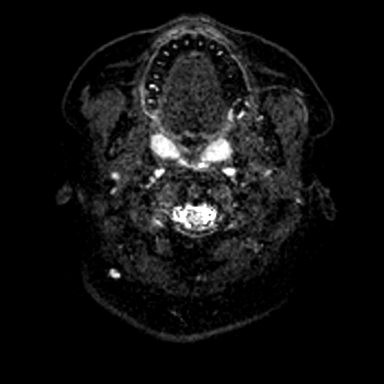
[im 24/96]
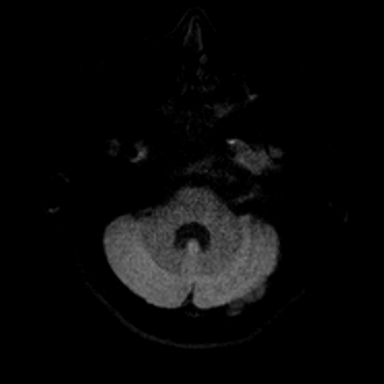
[im 48/96]
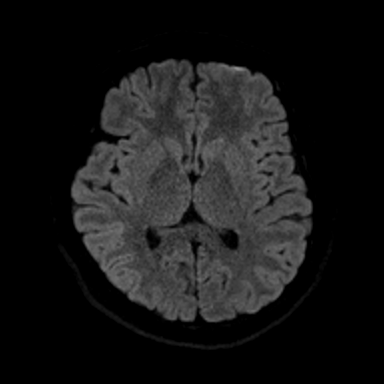
[im 72/96]
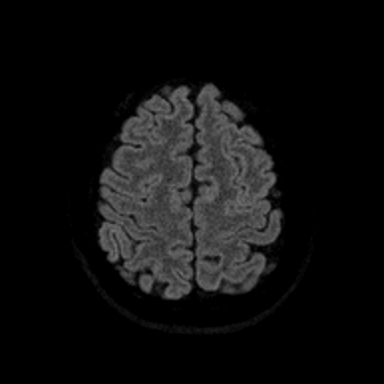
[im 96/96]
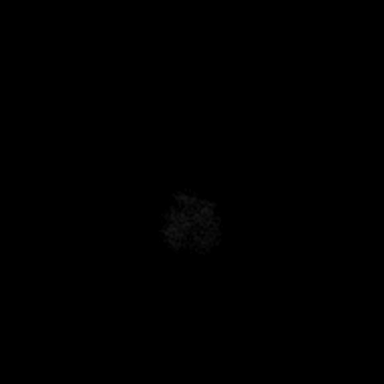

[Series 7: ax dwi_adc · axial · 3.0mm · 0.60mm/px · z∈[-117,+37]mm · 2 of 48 slices shown]
[im 1/48]
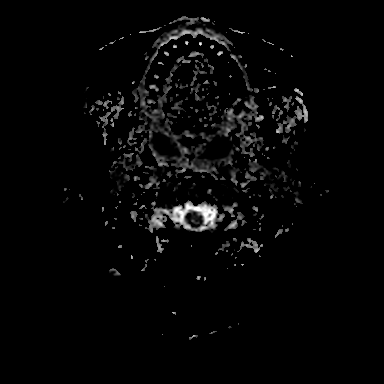
[im 48/48]
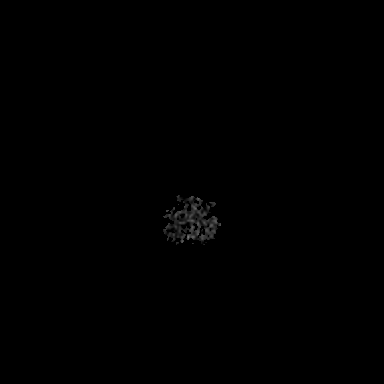

[Series 8: cor dwi_tracew · coronal · 5.0mm · 0.60mm/px · 3 of 80 slices shown]
[im 1/80]
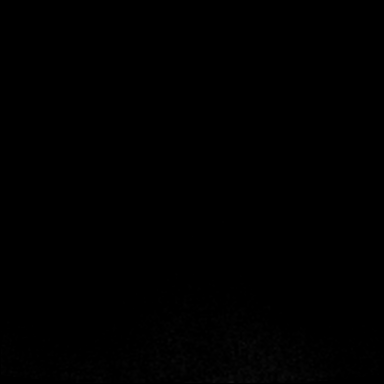
[im 27/80]
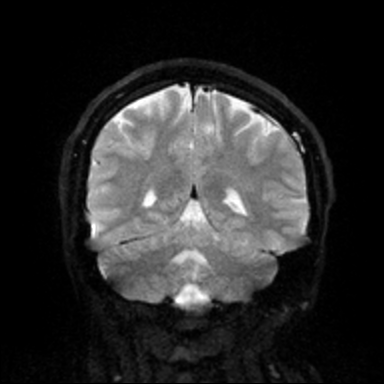
[im 53/80]
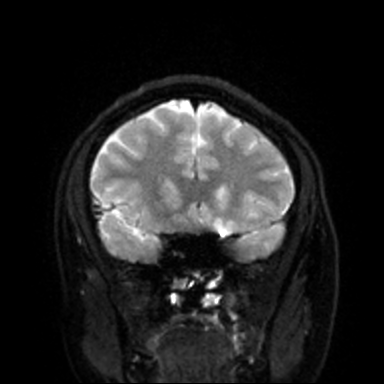

[Series 10: T2 · axial · 5.0mm · 0.53mm/px · 1 of 25 slices shown (1 of 2)]
[im 1/25]
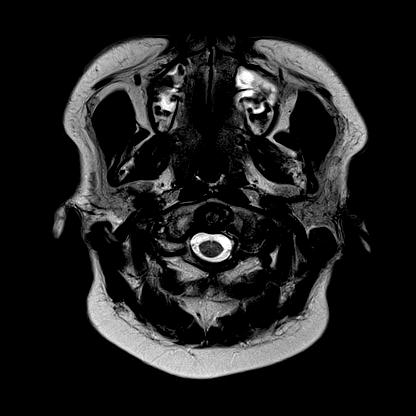

[Series 15: FLAIR · axial · 3.0mm · 0.53mm/px · z∈[-109,+52]mm · 3 of 55 slices shown (1 of 2)]
[im 1/55]
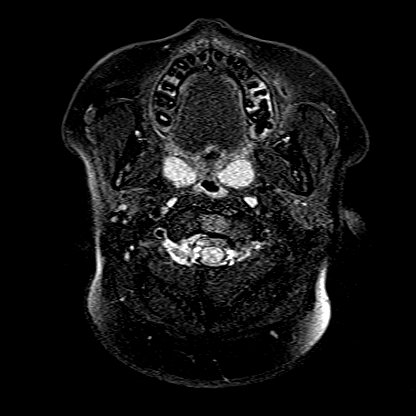
[im 28/55]
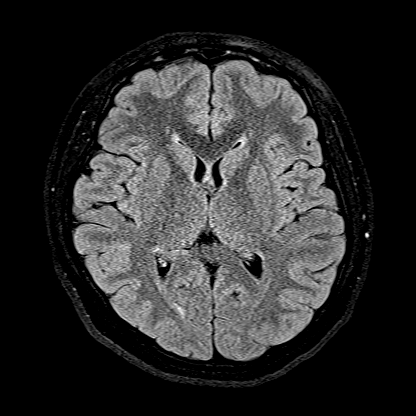
[im 55/55]
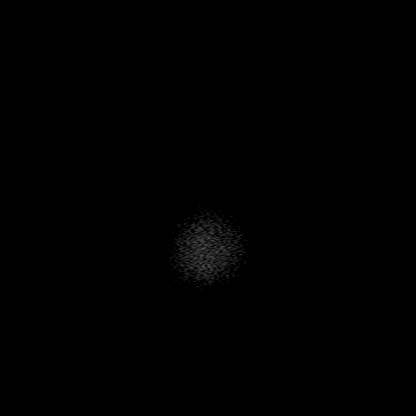

[Series 16: T1 · axial · 1.0mm · 0.98mm/px · z∈[-108,+50]mm · 8 of 160 slices shown (2 of 3)]
[im 1/160]
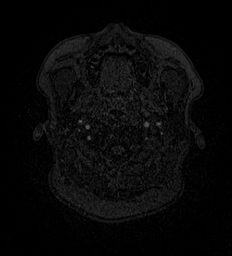
[im 23/160]
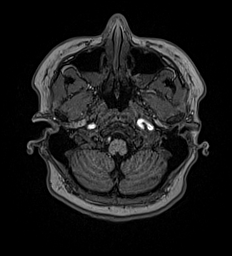
[im 46/160]
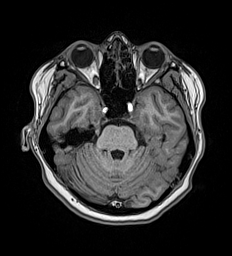
[im 69/160]
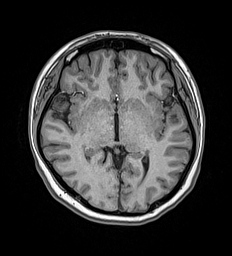
[im 91/160]
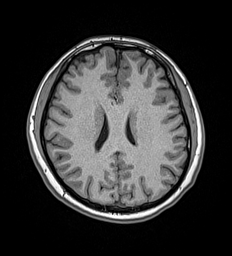
[im 114/160]
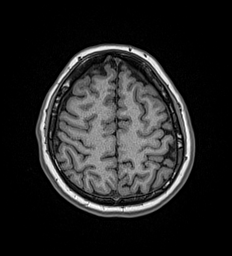
[im 137/160]
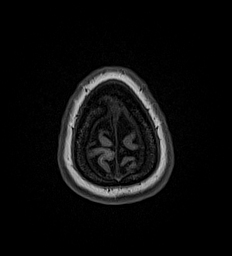
[im 160/160]
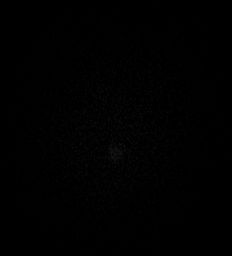

[Series 17: T2 · coronal · 3.0mm · 0.47mm/px · 2 of 35 slices shown (2 of 2)]
[im 1/35]
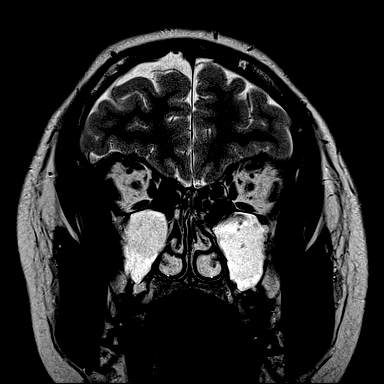
[im 35/35]
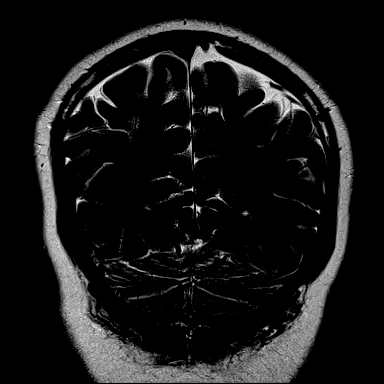

[Series 18: FLAIR · coronal · 3.0mm · 0.47mm/px · 2 of 35 slices shown (2 of 2)]
[im 1/35]
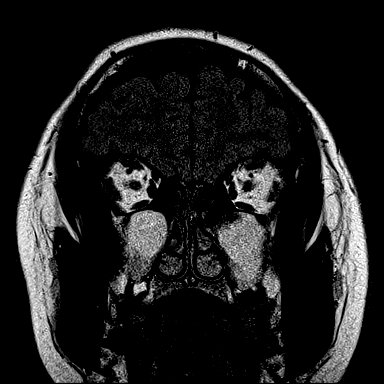
[im 35/35]
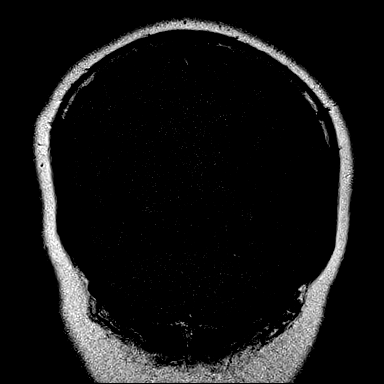

[Series 1019: T1 · coronal · 3.0mm · 0.49mm/px · 5 of 107 slices shown (3 of 3)]
[im 1/107]
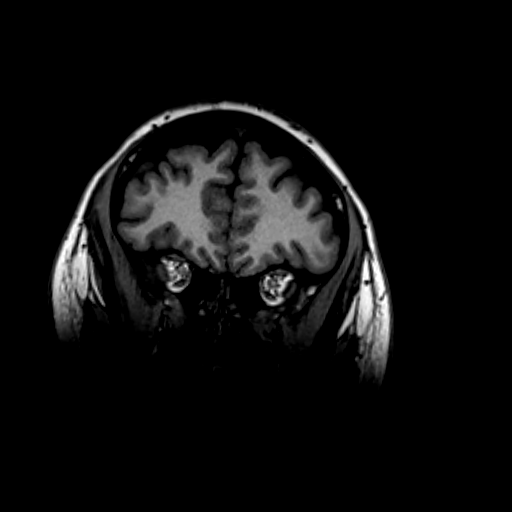
[im 27/107]
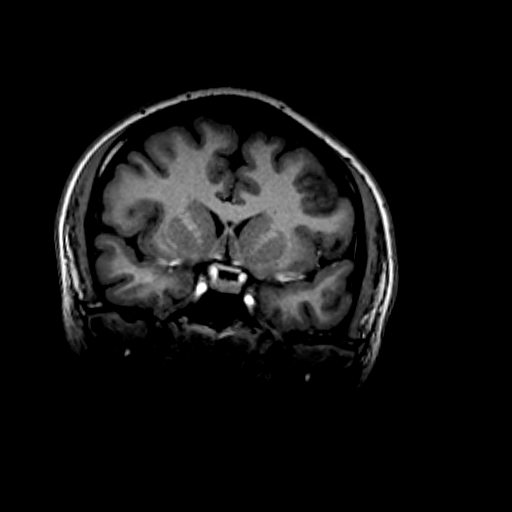
[im 54/107]
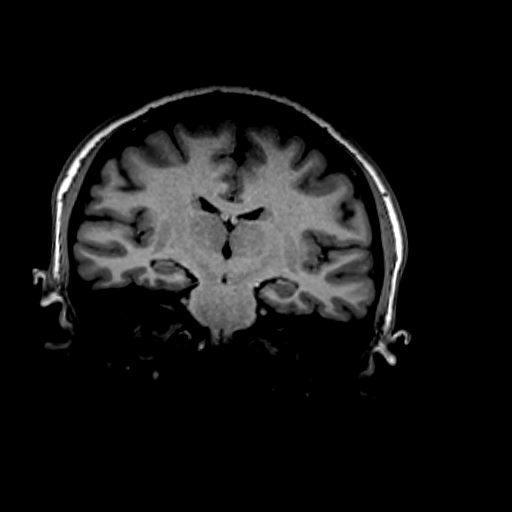
[im 80/107]
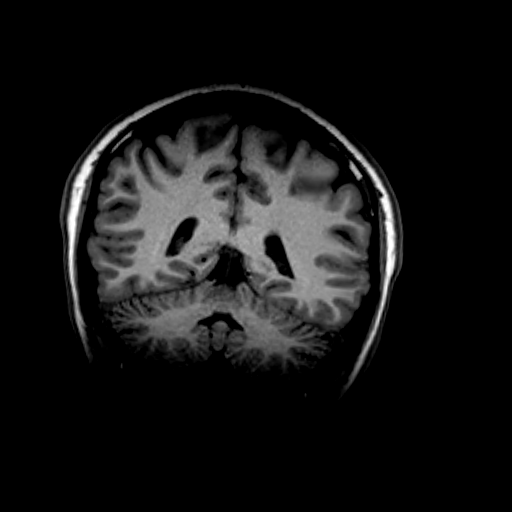
[im 107/107]
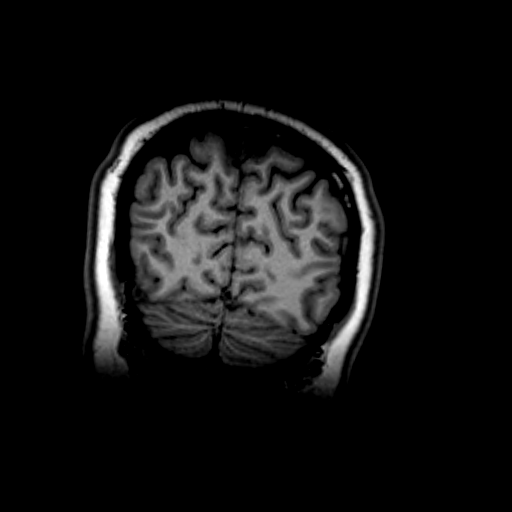

[33 of 48 positions shown; findings below may reference images not displayed]

FINDINGS: Brain: Cerebral volume within normal limits for patient age. No
focal parenchymal signal abnormality identified. No intrinsic
temporal lobe abnormality. No visible cortical dysplasia.

No abnormal foci of restricted diffusion to suggest acute or
subacute ischemia. Gray-white matter differentiation well
maintained. No encephalomalacia to suggest chronic infarction. No
foci of susceptibility artifact to suggest acute or chronic
intracranial hemorrhage.

No mass lesion, midline shift or mass effect. No hydrocephalus. No
extra-axial fluid collection. Major dural sinuses are grossly
patent.

Pituitary gland and suprasellar region are normal. Midline
structures intact and normal.

Vascular: Major intracranial vascular flow voids well maintained and
normal in appearance.

Skull and upper cervical spine: Craniocervical junction normal.
Visualized upper cervical spine within normal limits. Bone marrow
signal intensity normal. No scalp soft tissue abnormality.

Sinuses/Orbits: Globes and orbital soft tissues within normal
limits.

Scattered mucosal thickening seen throughout the frontoethmoidal and
maxillary sinuses, with superimposed air-fluid levels within both
maxillary sinuses. No mastoid effusion. Inner ear structures grossly
normal.

Other: None.
IMPRESSION: 1. Normal brain MRI. No structural findings to explain patient's
symptoms identified.
2. Acute maxillary sinusitis.

Please note that this scan was ordered as a brain with and without
contrast, however, IV access was unable to be obtained for contrast
administration.
